# Patient Record
Sex: Male | Born: 2005 | Race: White | Hispanic: No | Marital: Single | Smoking: Never smoker
Health system: Southern US, Community
[De-identification: ages and names within clinical notes are randomized; demographics above are authoritative.]

## PROBLEM LIST (undated history)

## (undated) DIAGNOSIS — K311 Adult hypertrophic pyloric stenosis: Secondary | ICD-10-CM

## (undated) HISTORY — PX: PYLOROMYOTOMY: SHX5274

## (undated) HISTORY — PX: TESTICLE SURGERY: SHX794

## (undated) HISTORY — PX: HERNIA REPAIR: SHX51

---
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
## 2023-01-01 DIAGNOSIS — Z419 Encounter for procedure for purposes other than remedying health state, unspecified: Secondary | ICD-10-CM | POA: Diagnosis not present

## 2023-01-31 DIAGNOSIS — Z419 Encounter for procedure for purposes other than remedying health state, unspecified: Secondary | ICD-10-CM | POA: Diagnosis not present

## 2023-03-03 DIAGNOSIS — Z419 Encounter for procedure for purposes other than remedying health state, unspecified: Secondary | ICD-10-CM | POA: Diagnosis not present

## 2023-04-03 DIAGNOSIS — Z419 Encounter for procedure for purposes other than remedying health state, unspecified: Secondary | ICD-10-CM | POA: Diagnosis not present

## 2023-05-03 DIAGNOSIS — Z419 Encounter for procedure for purposes other than remedying health state, unspecified: Secondary | ICD-10-CM | POA: Diagnosis not present

## 2023-06-03 DIAGNOSIS — Z419 Encounter for procedure for purposes other than remedying health state, unspecified: Secondary | ICD-10-CM | POA: Diagnosis not present

## 2023-07-03 DIAGNOSIS — Z419 Encounter for procedure for purposes other than remedying health state, unspecified: Secondary | ICD-10-CM | POA: Diagnosis not present

## 2023-08-03 DIAGNOSIS — Z419 Encounter for procedure for purposes other than remedying health state, unspecified: Secondary | ICD-10-CM | POA: Diagnosis not present

## 2023-09-03 DIAGNOSIS — Z419 Encounter for procedure for purposes other than remedying health state, unspecified: Secondary | ICD-10-CM | POA: Diagnosis not present

## 2023-09-12 ENCOUNTER — Encounter: Payer: Self-pay | Admitting: Emergency Medicine

## 2023-09-12 ENCOUNTER — Ambulatory Visit
Admission: EM | Admit: 2023-09-12 | Discharge: 2023-09-12 | Disposition: A | Discharge status: Home / Self Care | Payer: Medicaid Other

## 2023-09-12 DIAGNOSIS — L0291 Cutaneous abscess, unspecified: Secondary | ICD-10-CM | POA: Diagnosis not present

## 2023-09-12 MED ORDER — DOXYCYCLINE HYCLATE 100 MG PO CAPS: 100.0000 mg | ORAL_CAPSULE | Freq: Two times a day (BID) | ORAL | 0 refills | Status: AC

## 2023-09-12 NOTE — ED Triage Notes (Addendum)
 Pt reports cyst or possible insect bite to L posterior neck. Pt states it started itching x1 week ago and has increased in size and become more painful. Pt reports hx of cystic acne and is not sure what this spot is. Tylenol  gives pain relief. Reports pain worsens with irritation from clothing and or pressure from touching or laying on it. Asked about possible dermatology referral if needed.

## 2023-09-16 NOTE — ED Provider Notes (Signed)
[facility]    CSN: [id number] Arrival date & time: 09/12/23  1633      History   Chief Complaint Chief Complaint  Patient presents with   Cyst   Insect Bite    HPI Steve Fitzpatrick is a 18 y.o. male.   Patient here today for evaluation of an area of swelling and pain to the back of his neck that he first noticed as an area with itch about a week ago. He notes it has increased in size and tenderness. He has cystic acne and is not sure if it is related or he had insect bite. He has not had fever, nausea and vomiting.   The history is provided by the patient.    Past Medical History:  Diagnosis Date   Pyloric stenosis     Patient Active Problem List   Diagnosis Date Noted   Allergic rhinitis 11/30/2012    Past Surgical History:  Procedure Laterality Date   HERNIA REPAIR     PYLOROMYOTOMY     to repair pyloric stenosis   TESTICLE SURGERY         Home Medications    Prior to Admission medications   Medication Sig Start Date End Date Taking? Authorizing Provider  doxycycline (VIBRAMYCIN) 100 MG capsule Take 1 capsule (100 mg total) by mouth 2 (two) times daily for 7 days. 09/12/23 09/19/23 Yes Tomi Bamberger, PA-C  cetirizine (ZYRTEC) 10 MG tablet 1 tab p.o. nightly as needed allergies. Patient not taking: Reported on 09/12/2023 07/23/21   Lucio Edward, MD  cetirizine (ZYRTEC) 10 MG tablet Take 10 mg by mouth daily. Patient not taking: Reported on 09/12/2023 06/08/17   [provider]  erythromycin ophthalmic ointment 1 cm in effected eye twice a day for 5 days as need for discharge. Patient not taking: Reported on 09/12/2023 12/17/21   Lucio Edward, MD  fluticasone [facility]) 50 MCG/ACT nasal spray 1 spray each nostril once a day as needed congestion. Patient not taking: Reported on 09/12/2023 07/23/21   Lucio Edward, MD  fluticasone [facility]) 50 MCG/ACT nasal spray Place 2 sprays into both nostrils daily. Patient not taking: Reported  on 09/12/2023 11/30/12   [provider]  ibuprofen (ADVIL) 100 MG/5ML suspension Take 10 mg/kg by mouth every 6 (six) hours as needed. Patient not taking: Reported on 09/12/2023 07/01/15   [provider]    Family History Family History  Problem Relation Age of Onset   Depression Father    Diabetes Maternal Grandfather    Graves' disease Paternal Uncle    Hypertension Paternal Uncle    Hypertension Paternal Uncle    Seizures Paternal Uncle     Social History Social History   Tobacco Use   Smoking status: Never   Smokeless tobacco: Never  Vaping Use   Vaping status: Never Used  Substance Use Topics   Alcohol use: Never   Drug use: Never     Allergies   Patient has no known allergies.   Review of Systems Review of Systems  Constitutional:  Negative for fever.  HENT:  Negative for congestion.   Eyes:  Negative for discharge and redness.  Respiratory:  Negative for shortness of breath.   Gastrointestinal:  Negative for nausea and vomiting.  Skin:  Positive for color change. Negative for wound.     Physical Exam Triage Vital Signs ED Triage Vitals  Encounter Vitals Group     BP 09/12/23 1720 138/82     Systolic BP  Percentile --      Diastolic BP Percentile --      Pulse Rate 09/12/23 1720 89     Resp 09/12/23 1720 16     Temp 09/12/23 1720 98.6 F (37 C)     Temp Source 09/12/23 1720 Oral     SpO2 09/12/23 1720 96 %     Weight --      Height --      Head Circumference --      Peak Flow --      Pain Score 09/12/23 1721 0     Pain Loc --      Pain Education --      Exclude from Growth Chart --    No data found.  Updated Vital Signs BP 138/82 (BP Location: Left Arm)   Pulse 89   Temp 98.6 F (37 C) (Oral)   Resp 16   SpO2 96%    Physical Exam Vitals and nursing note reviewed.  Constitutional:      General: He is not in acute distress.    Appearance: Normal appearance. He is not ill-appearing.  HENT:     Head: Normocephalic  and atraumatic.  Eyes:     Conjunctiva/sclera: Conjunctivae normal.  Cardiovascular:     Rate and Rhythm: Normal rate.  Pulmonary:     Effort: Pulmonary effort is normal. No respiratory distress.  Skin:    Comments: Approx 4 cm area of induration, erythema to posterior left lower neck with TTP, no active drainage  Neurological:     Mental Status: He is alert.      UC Treatments / Results  Labs (all labs ordered are listed, but only abnormal results are displayed) Labs Reviewed - No data to display  EKG   Radiology No results found.  Procedures Procedures (including critical care time)  Medications Ordered in UC Medications - No data to display  Initial Impression / Assessment and Plan / UC Course  I have reviewed the triage vital signs and the nursing notes.  Pertinent labs & imaging results that were available during my care of the patient were reviewed by me and considered in my medical decision making (see chart for details).   Will treat to cover abscess with doxycycline. Advised warm compresses to promote drainage. Encouraged follow up if no improvement or with any further concerns.    Final Clinical Impressions(s) / UC Diagnoses   Final diagnoses:  Abscess   Discharge Instructions   None    ED Prescriptions     Medication Sig Dispense Auth. Provider   doxycycline (VIBRAMYCIN) 100 MG capsule Take 1 capsule (100 mg total) by mouth 2 (two) times daily for 7 days. 14 capsule Tomi Bamberger, PA-C      PDMP not reviewed this encounter.   Tomi Bamberger, PA-C 09/16/23 [phone]

## 2023-09-22 ENCOUNTER — Ambulatory Visit
Admission: EM | Admit: 2023-09-22 | Discharge: 2023-09-22 | Disposition: A | Discharge status: Home / Self Care | Payer: Medicaid Other | Attending: Internal Medicine | Admitting: Internal Medicine

## 2023-09-22 ENCOUNTER — Encounter: Payer: Self-pay | Admitting: Emergency Medicine

## 2023-09-22 DIAGNOSIS — L0211 Cutaneous abscess of neck: Secondary | ICD-10-CM

## 2023-09-22 MED ORDER — SULFAMETHOXAZOLE-TRIMETHOPRIM 800-160 MG PO TABS: 1.0000 | ORAL_TABLET | Freq: Two times a day (BID) | ORAL | 0 refills | Status: AC

## 2023-09-22 NOTE — ED Triage Notes (Signed)
 Pt was seen on 2/10 for cyst on left posterior neck. Today he presents due to abscess popped today and is much larger and red than initally. He has finished abx.

## 2023-09-22 NOTE — ED Provider Notes (Signed)
 [facility]    CSN: [id number] Arrival date & time: 09/22/23  1206      History   Chief Complaint Chief Complaint  Patient presents with   Abscess    HPI Steve Fitzpatrick is a 18 y.o. male.   Patient presents to urgent care with his mother who contributes to the history for evaluation of abscess to the posterior neck that started approximately 10 to 12 days ago.  He was seen at another urgent care at the beginning of symptoms where he was placed on doxycycline antibiotic and advised to return to urgent care if symptoms worsen.  He finished the doxycycline 3 days ago and states the abscess has grown significantly in size and tenderness over the last 3 days.  Abscess has not started to drain yet and is very tender, warm, and red.  No history of immunosuppression/diabetes.  No allergies to antibiotics.  He has not had any fevers, chills, body aches, or nausea/vomiting recently.  Taking over-the-counter medications for pain without relief.   Abscess   Past Medical History:  Diagnosis Date   Pyloric stenosis     Patient Active Problem List   Diagnosis Date Noted   Allergic rhinitis 11/30/2012    Past Surgical History:  Procedure Laterality Date   HERNIA REPAIR     PYLOROMYOTOMY     to repair pyloric stenosis   TESTICLE SURGERY         Home Medications    Prior to Admission medications   Medication Sig Start Date End Date Taking? Authorizing Provider  sulfamethoxazole-trimethoprim (BACTRIM DS) 800-160 MG tablet Take 1 tablet by mouth 2 (two) times daily for 7 days. 09/22/23 09/29/23 Yes Carlisle Beers, FNP  cetirizine (ZYRTEC) 10 MG tablet 1 tab p.o. nightly as needed allergies. Patient not taking: Reported on 09/12/2023 07/23/21   Lucio Edward, MD  cetirizine (ZYRTEC) 10 MG tablet Take 10 mg by mouth daily. Patient not taking: Reported on 09/12/2023 06/08/17   [provider]  erythromycin ophthalmic ointment 1 cm in effected eye twice a  day for 5 days as need for discharge. Patient not taking: Reported on 09/12/2023 12/17/21   Lucio Edward, MD  fluticasone [facility]) 50 MCG/ACT nasal spray 1 spray each nostril once a day as needed congestion. Patient not taking: Reported on 09/12/2023 07/23/21   Lucio Edward, MD  fluticasone [facility]) 50 MCG/ACT nasal spray Place 2 sprays into both nostrils daily. Patient not taking: Reported on 09/12/2023 11/30/12   [provider]  ibuprofen (ADVIL) 100 MG/5ML suspension Take 10 mg/kg by mouth every 6 (six) hours as needed. Patient not taking: Reported on 09/12/2023 07/01/15   [provider]    Family History Family History  Problem Relation Age of Onset   Depression Father    Diabetes Maternal Grandfather    Graves' disease Paternal Uncle    Hypertension Paternal Uncle    Hypertension Paternal Uncle    Seizures Paternal Uncle     Social History Social History   Tobacco Use   Smoking status: Never   Smokeless tobacco: Never  Vaping Use   Vaping status: Never Used  Substance Use Topics   Alcohol use: Never   Drug use: Never     Allergies   Patient has no known allergies.   Review of Systems Review of Systems Per HPI  Physical Exam Triage Vital Signs ED Triage Vitals  Encounter Vitals Group     BP 09/22/23 1338 130/78  Systolic BP Percentile --      Diastolic BP Percentile --      Pulse Rate 09/22/23 1338 64     Resp 09/22/23 1338 (!) 24     Temp 09/22/23 1338 98.2 F (36.8 C)     Temp Source 09/22/23 1338 Oral     SpO2 09/22/23 1338 98 %     Weight --      Height --      Head Circumference --      Peak Flow --      Pain Score 09/22/23 1340 0     Pain Loc --      Pain Education --      Exclude from Growth Chart --    No data found.  Updated Vital Signs BP 130/78 (BP Location: Right Arm)   Pulse 64   Temp 98.2 F (36.8 C) (Oral)   Resp (!) 24   SpO2 98%   Visual Acuity Right Eye Distance:   Left Eye Distance:    Bilateral Distance:    Right Eye Near:   Left Eye Near:    Bilateral Near:     Physical Exam Vitals and nursing note reviewed.  Constitutional:      Appearance: He is not ill-appearing or toxic-appearing.  HENT:     Head: Normocephalic and atraumatic.     Right Ear: Hearing and external ear normal.     Left Ear: Hearing and external ear normal.     Nose: Nose normal.     Mouth/Throat:     Lips: Pink.  Eyes:     General: Lids are normal. Vision grossly intact. Gaze aligned appropriately.     Extraocular Movements: Extraocular movements intact.     Conjunctiva/sclera: Conjunctivae normal.  Neck:   Pulmonary:     Effort: Pulmonary effort is normal.  Musculoskeletal:     Cervical back: Neck supple.  Lymphadenopathy:     Cervical: Cervical adenopathy present.  Skin:    General: Skin is warm and dry.     Capillary Refill: Capillary refill takes less than 2 seconds.     Findings: Abscess (Fluctuant 3cm in diameter abscess to the posterior left neck with erythema, swelling, and tenderness to palpation) present. No rash.  Neurological:     General: No focal deficit present.     Mental Status: He is alert and oriented to person, place, and time. Mental status is at baseline.     Cranial Nerves: No dysarthria or facial asymmetry.  Psychiatric:        Mood and Affect: Mood normal.        Speech: Speech normal.        Behavior: Behavior normal.        Thought Content: Thought content normal.        Judgment: Judgment normal.    Infected abscess of left posterior neck   UC Treatments / Results  Labs (all labs ordered are listed, but only abnormal results are displayed) Labs Reviewed - No data to display  EKG   Radiology No results found.  Procedures Incision and Drainage  Date/Time: 09/22/2023 2:34 PM  Performed by: Carlisle Beers, FNP Authorized by: Carlisle Beers, FNP   Consent:    Consent obtained:  Verbal   Consent given by:  Patient and  parent   Risks, benefits, and alternatives were discussed: yes     Risks discussed:  Bleeding, damage to other organs, incomplete drainage, infection and pain Universal protocol:  Patient identity confirmed:  Verbally with patient Location:    Type:  Abscess   Size:  3cm by 3cm   Location:  Neck   Neck location:  L posterior Pre-procedure details:    Skin preparation:  Chlorhexidine and povidone-iodine Sedation:    Sedation type:  None Anesthesia:    Anesthesia method:  Local infiltration   Local anesthetic:  Lidocaine 1% w/o epi Procedure type:    Complexity:  Complex Procedure details:    Incision types:  Stab incision   Incision depth:  Dermal   Wound management:  Probed and deloculated and irrigated with saline   Drainage:  Bloody and purulent   Drainage amount:  Copious   Wound treatment:  Wound left open   Packing materials:  None Post-procedure details:    Procedure completion:  Tolerated well, no immediate complications  (including critical care time)  Medications Ordered in UC Medications - No data to display  Initial Impression / Assessment and Plan / UC Course  I have reviewed the triage vital signs and the nursing notes.  Pertinent labs & imaging results that were available during my care of the patient were reviewed by me and considered in my medical decision making (see chart for details).   1. Abscess of skin of neck See incision and drainage note above for further detail regarding incision and drainage procedure, patient tolerated well.   Wound cleansed and dressed in clinic. Wound care discussed (warm compresses, dressing changes, etc).  Bactrim antibiotic ordered and supportive care discussed/outlined in AVS.   Counseled patient on potential for adverse effects with medications prescribed/recommended today, strict ER and return-to-clinic precautions discussed, patient verbalized understanding.    Final Clinical Impressions(s) / UC Diagnoses   Final  diagnoses:  Abscess of skin of neck     Discharge Instructions      We drained your abscess today in the clinic and left open to continue to drain.  Continue to use warm compresses to the wound to further encourage drainage from the wound.  You may do this in the shower as well with gentle compresses to the area to allow more infected material to drain.   Take antibiotic as prescribed to treat infection to the abscess.   Change your dressings frequently as the wound heals.   You may take over the counter pain medicines as needed for pain once the numbing wears off.  If you notice any worsening signs of infection such as redness, swelling, fever, or worsening drainage, please return to urgent care for reevaluation.       ED Prescriptions     Medication Sig Dispense Auth. Provider   sulfamethoxazole-trimethoprim (BACTRIM DS) 800-160 MG tablet Take 1 tablet by mouth 2 (two) times daily for 7 days. 14 tablet Carlisle Beers, FNP      PDMP not reviewed this encounter.   Reita May [place], [place] 09/26/23 [phone]

## 2023-09-22 NOTE — Discharge Instructions (Signed)
 We drained your abscess today in the clinic and left open to continue to drain.  Continue to use warm compresses to the wound to further encourage drainage from the wound.  You may do this in the shower as well with gentle compresses to the area to allow more infected material to drain.   Take antibiotic as prescribed to treat infection to the abscess.   Change your dressings frequently as the wound heals.   You may take over the counter pain medicines as needed for pain once the numbing wears off.  If you notice any worsening signs of infection such as redness, swelling, fever, or worsening drainage, please return to urgent care for reevaluation.

## 2023-10-01 DIAGNOSIS — Z419 Encounter for procedure for purposes other than remedying health state, unspecified: Secondary | ICD-10-CM | POA: Diagnosis not present

## 2023-11-12 DIAGNOSIS — Z419 Encounter for procedure for purposes other than remedying health state, unspecified: Secondary | ICD-10-CM | POA: Diagnosis not present

## 2023-12-12 DIAGNOSIS — Z419 Encounter for procedure for purposes other than remedying health state, unspecified: Secondary | ICD-10-CM | POA: Diagnosis not present

## 2024-01-12 DIAGNOSIS — Z419 Encounter for procedure for purposes other than remedying health state, unspecified: Secondary | ICD-10-CM | POA: Diagnosis not present

## 2024-02-11 DIAGNOSIS — Z419 Encounter for procedure for purposes other than remedying health state, unspecified: Secondary | ICD-10-CM | POA: Diagnosis not present

## 2024-03-13 DIAGNOSIS — Z419 Encounter for procedure for purposes other than remedying health state, unspecified: Secondary | ICD-10-CM | POA: Diagnosis not present
